# Patient Record
Sex: Male | Born: 1970
Health system: Southern US, Community
[De-identification: ages and names within clinical notes are randomized; demographics above are authoritative.]

## PROBLEM LIST (undated history)

## (undated) DIAGNOSIS — I10 Essential (primary) hypertension: Secondary | ICD-10-CM

## (undated) DIAGNOSIS — J449 Chronic obstructive pulmonary disease, unspecified: Secondary | ICD-10-CM

## (undated) HISTORY — DX: Essential (primary) hypertension: I10

## (undated) HISTORY — DX: Chronic obstructive pulmonary disease, unspecified: J44.9

---

## 2004-09-16 ENCOUNTER — Emergency Department: Payer: Self-pay | Admitting: Emergency Medicine

## 2005-03-04 ENCOUNTER — Emergency Department: Payer: Self-pay | Admitting: Emergency Medicine

## 2006-01-10 ENCOUNTER — Emergency Department: Payer: Self-pay | Admitting: Emergency Medicine

## 2006-01-20 ENCOUNTER — Emergency Department: Payer: Self-pay | Admitting: Emergency Medicine

## 2006-01-21 ENCOUNTER — Emergency Department: Payer: Self-pay | Admitting: Emergency Medicine

## 2006-01-22 ENCOUNTER — Emergency Department: Payer: Self-pay | Admitting: Emergency Medicine

## 2008-08-26 ENCOUNTER — Emergency Department: Payer: Self-pay | Admitting: Emergency Medicine

## 2009-01-08 ENCOUNTER — Emergency Department: Payer: Self-pay | Admitting: Emergency Medicine

## 2010-06-14 ENCOUNTER — Emergency Department: Payer: Self-pay | Admitting: Emergency Medicine

## 2010-06-26 ENCOUNTER — Emergency Department: Payer: Self-pay | Admitting: Emergency Medicine

## 2011-08-20 ENCOUNTER — Emergency Department: Payer: Self-pay | Admitting: Emergency Medicine

## 2011-08-20 LAB — CBC
HCT: 44.1 % (ref 40.0–52.0)
HGB: 14.9 g/dL (ref 13.0–18.0)
RDW: 13.2 % (ref 11.5–14.5)

## 2011-08-20 LAB — COMPREHENSIVE METABOLIC PANEL
Anion Gap: 10 (ref 7–16)
Calcium, Total: 8.4 mg/dL — ABNORMAL LOW (ref 8.5–10.1)
Chloride: 105 mmol/L (ref 98–107)
Co2: 28 mmol/L (ref 21–32)
EGFR (African American): >60
EGFR (Non-African Amer.): >60
Potassium: 3.8 mmol/L (ref 3.5–5.1)
SGOT(AST): 15 U/L (ref 15–37)
SGPT (ALT): 27 U/L

## 2011-08-20 LAB — APTT: Activated PTT: 30.9 secs (ref 23.6–35.9)

## 2011-08-20 LAB — PROTIME-INR: INR: 0.9

## 2011-08-20 LAB — CK TOTAL AND CKMB (NOT AT ARMC)
CK, Total: 130 U/L (ref 35–232)
CK-MB: 1.3 ng/mL (ref 0.5–3.6)

## 2011-08-23 ENCOUNTER — Emergency Department: Payer: Self-pay | Admitting: Emergency Medicine

## 2011-08-23 LAB — COMPREHENSIVE METABOLIC PANEL
Albumin: 3.9 g/dL (ref 3.4–5.0)
Alkaline Phosphatase: 68 U/L (ref 50–136)
BUN: 10 mg/dL (ref 7–18)
Bilirubin,Total: 0.3 mg/dL (ref 0.2–1.0)
Chloride: 102 mmol/L (ref 98–107)
Co2: 30 mmol/L (ref 21–32)
Creatinine: 0.76 mg/dL (ref 0.60–1.30)
EGFR (African American): >60
EGFR (Non-African Amer.): >60
Glucose: 116 mg/dL — ABNORMAL HIGH (ref 65–99)
SGOT(AST): 15 U/L (ref 15–37)
SGPT (ALT): 27 U/L

## 2011-08-23 LAB — PROTIME-INR
INR: 0.9
Prothrombin Time: 13 secs (ref 11.5–14.7)

## 2011-08-23 LAB — URINALYSIS, COMPLETE
Bacteria: NONE SEEN
Bilirubin,UR: NEGATIVE
Glucose,UR: NEGATIVE mg/dL (ref 0–75)
Leukocyte Esterase: NEGATIVE
Nitrite: NEGATIVE
Specific Gravity: 1.013 (ref 1.003–1.030)
Squamous Epithelial: NONE SEEN
WBC UR: NONE SEEN /HPF (ref 0–5)

## 2011-08-23 LAB — CK TOTAL AND CKMB (NOT AT ARMC): CK, Total: 150 U/L (ref 35–232)

## 2011-08-23 LAB — TROPONIN I
Troponin-I: <0.02 ng/mL
Troponin-I: 0.02 ng/mL

## 2012-02-05 ENCOUNTER — Emergency Department: Payer: Self-pay | Admitting: Emergency Medicine

## 2012-02-05 LAB — CBC
HCT: 44.2 % (ref 40.0–52.0)
MCV: 88 fL (ref 80–100)
Platelet: 200 10*3/uL (ref 150–440)
RBC: 5.02 10*6/uL (ref 4.40–5.90)
RDW: 13.1 % (ref 11.5–14.5)
WBC: 8.1 10*3/uL (ref 3.8–10.6)

## 2012-02-05 LAB — URINALYSIS, COMPLETE
Glucose,UR: NEGATIVE mg/dL (ref 0–75)
Leukocyte Esterase: NEGATIVE
Nitrite: NEGATIVE
Protein: NEGATIVE
Specific Gravity: 1.023 (ref 1.003–1.030)
WBC UR: <1 /HPF (ref 0–5)

## 2012-02-05 LAB — BASIC METABOLIC PANEL
Anion Gap: 7 (ref 7–16)
BUN: 13 mg/dL (ref 7–18)
Calcium, Total: 8.6 mg/dL (ref 8.5–10.1)
Chloride: 105 mmol/L (ref 98–107)
Co2: 32 mmol/L (ref 21–32)
EGFR (Non-African Amer.): >60
Glucose: 91 mg/dL (ref 65–99)
Osmolality: 287 (ref 275–301)
Potassium: 4.1 mmol/L (ref 3.5–5.1)

## 2012-02-05 LAB — HEPATIC FUNCTION PANEL A (ARMC)
Albumin: 3.6 g/dL (ref 3.4–5.0)
SGOT(AST): 23 U/L (ref 15–37)
SGPT (ALT): 35 U/L (ref 12–78)

## 2013-02-10 ENCOUNTER — Ambulatory Visit: Payer: Self-pay | Admitting: Internal Medicine

## 2013-03-26 ENCOUNTER — Ambulatory Visit: Payer: Self-pay | Admitting: Internal Medicine

## 2014-01-03 ENCOUNTER — Emergency Department: Payer: Self-pay | Admitting: Emergency Medicine

## 2014-01-03 LAB — CBC
HCT: 43.2 % (ref 40.0–52.0)
HGB: 14.4 g/dL (ref 13.0–18.0)
MCH: 29.3 pg (ref 26.0–34.0)
MCHC: 33.3 g/dL (ref 32.0–36.0)
MCV: 88 fL (ref 80–100)
Platelet: 174 10*3/uL (ref 150–440)
RBC: 4.91 10*6/uL (ref 4.40–5.90)
RDW: 13.6 % (ref 11.5–14.5)
WBC: 11.9 10*3/uL — AB (ref 3.8–10.6)

## 2014-01-04 LAB — BASIC METABOLIC PANEL
ANION GAP: 6 — AB (ref 7–16)
BUN: 9 mg/dL (ref 7–18)
Calcium, Total: 8.5 mg/dL (ref 8.5–10.1)
Chloride: 103 mmol/L (ref 98–107)
Co2: 29 mmol/L (ref 21–32)
Creatinine: 0.89 mg/dL (ref 0.60–1.30)
EGFR (African American): >60
EGFR (Non-African Amer.): >60
GLUCOSE: 88 mg/dL (ref 65–99)
Osmolality: 274 (ref 275–301)
Potassium: 3.9 mmol/L (ref 3.5–5.1)
SODIUM: 138 mmol/L (ref 136–145)

## 2014-01-04 LAB — PRO B NATRIURETIC PEPTIDE: B-TYPE NATIURETIC PEPTID: 333 pg/mL — AB (ref 0–125)

## 2014-01-04 LAB — TROPONIN I: Troponin-I: <0.02 ng/mL
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
## 2016-12-28 ENCOUNTER — Ambulatory Visit: Payer: Self-pay | Admitting: Dietician

## 2017-01-02 ENCOUNTER — Ambulatory Visit: Payer: Self-pay | Admitting: Dietician

## 2017-01-02 ENCOUNTER — Encounter: Payer: Self-pay | Admitting: Dietician

## 2017-07-22 DIAGNOSIS — A084 Viral intestinal infection, unspecified: Secondary | ICD-10-CM | POA: Diagnosis not present

## 2017-08-14 DIAGNOSIS — I10 Essential (primary) hypertension: Secondary | ICD-10-CM | POA: Diagnosis not present

## 2017-08-14 DIAGNOSIS — F411 Generalized anxiety disorder: Secondary | ICD-10-CM | POA: Diagnosis not present

## 2017-08-14 DIAGNOSIS — M722 Plantar fascial fibromatosis: Secondary | ICD-10-CM | POA: Diagnosis not present

## 2017-08-14 DIAGNOSIS — G4733 Obstructive sleep apnea (adult) (pediatric): Secondary | ICD-10-CM | POA: Diagnosis not present

## 2017-12-13 DIAGNOSIS — K5904 Chronic idiopathic constipation: Secondary | ICD-10-CM | POA: Diagnosis not present

## 2017-12-13 DIAGNOSIS — K219 Gastro-esophageal reflux disease without esophagitis: Secondary | ICD-10-CM | POA: Diagnosis not present

## 2017-12-13 DIAGNOSIS — G5793 Unspecified mononeuropathy of bilateral lower limbs: Secondary | ICD-10-CM | POA: Diagnosis not present

## 2017-12-13 DIAGNOSIS — I10 Essential (primary) hypertension: Secondary | ICD-10-CM | POA: Diagnosis not present

## 2018-02-17 DIAGNOSIS — R202 Paresthesia of skin: Secondary | ICD-10-CM | POA: Diagnosis not present

## 2018-02-25 ENCOUNTER — Encounter (INDEPENDENT_AMBULATORY_CARE_PROVIDER_SITE_OTHER): Payer: Self-pay | Admitting: Vascular Surgery

## 2018-02-26 ENCOUNTER — Encounter (INDEPENDENT_AMBULATORY_CARE_PROVIDER_SITE_OTHER): Payer: Self-pay | Admitting: Vascular Surgery

## 2018-02-26 ENCOUNTER — Ambulatory Visit (INDEPENDENT_AMBULATORY_CARE_PROVIDER_SITE_OTHER): Payer: BLUE CROSS/BLUE SHIELD | Admitting: Vascular Surgery

## 2018-02-26 VITALS — BP 155/90 | HR 73 | Resp 14 | Ht 71.0 in | Wt 183.0 lb

## 2018-02-26 DIAGNOSIS — R600 Localized edema: Secondary | ICD-10-CM | POA: Insufficient documentation

## 2018-02-26 DIAGNOSIS — I83813 Varicose veins of bilateral lower extremities with pain: Secondary | ICD-10-CM | POA: Insufficient documentation

## 2018-02-26 NOTE — Progress Notes (Signed)
Subjective:    Patient ID: Gregory Love, male    DOB: 08-13-1970, 47 y.o.   MRN: [id number] Chief Complaint  Patient presents with  . New Patient (Initial Visit)    Varicose Veins and foot swelling   Self-referred new patient for evaluation of bilateral lower extremity painful varicosities and edema.  The patient recently lost over 100 pounds.  The patient endorses a long-standing history of varicosities noted to the bilateral legs.  The patient notes that these varicosities have become progressively more painful over time.  The patient also notes that his varicosities have increased in number in size over the last few years.  The patient also experiences edema to the bilateral legs.  This edema is associated with discomfort as well.  The patient feels that his symptoms have progressed to the point that he is unable to function on a daily basis and they have become more painful. patient feels that his symptoms have not become lifestyle limiting.  At this time patient does not engage in conservative therapy including wearing medical grade 1 compression socks, elevating his legs and remaining active on a daily basis.  The patient denies any recent surgery or trauma to the bilateral legs, DVT history, recurrent cellulitis.  The patient does experience bilateral calf cramping intermittently with activity at times.  The patient denies any rest pain or ulcer formation to the bilateral lower extremity.  Patient denies any fever, nausea vomiting.  Review of Systems  Constitutional: Negative.   HENT: Negative.   Eyes: Negative.   Respiratory: Negative.   Cardiovascular: Positive for leg swelling.       Painful varicose veins  Gastrointestinal: Negative.   Endocrine: Negative.   Genitourinary: Negative.   Musculoskeletal: Negative.   Skin: Negative.   Allergic/Immunologic: Negative.   Neurological: Negative.   Hematological: Negative.   Psychiatric/Behavioral: Negative.       Objective:   Physical Exam  Constitutional: He is oriented to person, place, and time. He appears well-developed and well-nourished. No distress.  HENT:  Head: Normocephalic and atraumatic.  Right Ear: External ear normal.  Left Ear: External ear normal.  Eyes: Pupils are equal, round, and reactive to light. EOM are normal.  Neck: Normal range of motion. Neck supple.  Cardiovascular: Normal rate, regular rhythm, normal heart sounds and intact distal pulses.  Pulses:      Radial pulses are 2+ on the right side, and 2+ on the left side.  Hard to palpate pedal pulses however the bilateral feet are warm  Pulmonary/Chest: Effort normal and breath sounds normal.  Musculoskeletal: Normal range of motion. He exhibits edema (Mild 1+ pitting edema noted bilaterally).  Neurological: He is alert and oriented to person, place, and time.  Skin: He is not diaphoretic.  Scattered greater than 1 cm varicosities noted along the medial aspect of the bilateral legs almost the whole length of each leg.  Scattered less than 1 cm varicosities noted bilaterally.  There is no stasis dermatitis, fibrosis, cellulitis or active ulcerations at this time.  Psychiatric: He has a normal mood and affect. His behavior is normal. Judgment and thought content normal.  Vitals reviewed.  BP (!) 155/90 (BP Location: Right Arm, Patient Position: Sitting)   Pulse 73   Resp 14   Ht 5\' 11"  (1.803 m)   Wt 183 lb (83 kg)   BMI 25.52 kg/m   Past Medical History:  Diagnosis Date  . COPD (chronic obstructive pulmonary disease) (HCC)   . Hypertension  Social History   Socioeconomic History  . Marital status: Married    Spouse name: Not on file  . Number of children: Not on file  . Years of education: Not on file  . Highest education level: Not on file  Occupational History  . Not on file  Social Needs  . Financial resource strain: Not on file  . Food insecurity:    Worry: Not on file    Inability: Not on file  . Transportation  needs:    Medical: Not on file    Non-medical: Not on file  Tobacco Use  . Smoking status: Former Games developer  . Smokeless tobacco: Never Used  . Tobacco comment: Uses dip  Substance and Sexual Activity  . Alcohol use: Not Currently  . Drug use: Never  . Sexual activity: Not on file  Lifestyle  . Physical activity:    Days per week: Not on file    Minutes per session: Not on file  . Stress: Not on file  Relationships  . Social connections:    Talks on phone: Not on file    Gets together: Not on file    Attends religious service: Not on file    Active member of club or organization: Not on file    Attends meetings of clubs or organizations: Not on file    Relationship status: Not on file  . Intimate partner violence:    Fear of current or ex partner: Not on file    Emotionally abused: Not on file    Physically abused: Not on file    Forced sexual activity: Not on file  Other Topics Concern  . Not on file  Social History Narrative  . Not on file   Family History  Problem Relation Age of Onset  . Depression Paternal Uncle    No Known Allergies     Assessment & Plan:  Self-referred new patient for evaluation of bilateral lower extremity painful varicosities and edema.  The patient recently lost over 100 pounds.  The patient endorses a long-standing history of varicosities noted to the bilateral legs.  The patient notes that these varicosities have become progressively more painful over time.  The patient also notes that his varicosities have increased in number in size over the last few years.  The patient also experiences edema to the bilateral legs.  This edema is associated with discomfort as well.  The patient feels that his symptoms have progressed to the point that he is unable to function on a daily basis and they have become more painful. patient feels that his symptoms have not become lifestyle limiting.  At this time patient does not engage in conservative therapy including  wearing medical grade 1 compression socks, elevating his legs and remaining active on a daily basis.  The patient denies any recent surgery or trauma to the bilateral legs, DVT history, recurrent cellulitis.  The patient does experience bilateral calf cramping intermittently with activity at times.  The patient denies any rest pain or ulcer formation to the bilateral lower extremity.  Patient denies any fever, nausea vomiting.  1. Bilateral lower extremity edema - New The patient was encouraged to wear graduated compression stockings (20-30 mmHg) on a daily basis. The patient was instructed to begin wearing the stockings first thing in the morning and removing them in the evening. The patient was instructed specifically not to sleep in the stockings. Prescription given.  In addition, behavioral modification including elevation during the day will be  initiated. Anti-inflammatories for pain. I will bring the patient back in 3 months and have him undergo a bilateral lower extremity venous duplex to rule out any contributing venous versus lymphatic disease The patient will follow up in three months to asses conservative management.  Information on compression stockings was given to the patient. The patient was instructed to call the office in the interim if any worsening edema or ulcerations to the legs, feet or toes occurs. The patient expresses their understanding.  - VAS [place] LOWER EXTREMITY VENOUS REFLUX; Future  2. Varicose veins of bilateral lower extremities with pain - New Patient with multiple risk factors for peripheral artery disease Hard to palpate pedal pulses on exam I will bring the patient his convenience and have him undergo an ABI to assess for any contributing peripheral artery disease I have discussed with the patient at length the risk factors for and pathogenesis of atherosclerotic disease and encouraged a healthy diet, regular exercise regimen and blood pressure / glucose control.    The patient was encouraged to call the office in the interim if he experiences any claudication like symptoms, rest pain or ulcers to his feet / toes.  - VAS [place] ABI WITH/WO TBI; Future  Current Outpatient Medications on File Prior to Visit  Medication Sig Dispense Refill  . ALPRAZolam (XANAX) 1 MG tablet TAKE 1 TABLET BY MOUTH AT BEDTIME AS NEEDED SLEEP  2  . B-D 3CC LUER-LOK SYR 25GX1" 25G X 1" 3 ML MISC See admin instructions.  0  . budesonide-formoterol (SYMBICORT) 160-4.5 MCG/ACT inhaler Inhale into the lungs.    . Cholecalciferol (VITAMIN D-1000 MAX ST) 1000 units tablet Take by mouth.    . Cyanocobalamin (B-12 DOTS) 500 MCG TBDP Take by mouth.    . esomeprazole (NEXIUM) 40 MG capsule Take by mouth.    . gabapentin (NEURONTIN) 300 MG capsule Take by mouth.    . hydrochlorothiazide (HYDRODIURIL) 25 MG tablet Take by mouth.    Marland Kitchen ibuprofen (ADVIL,MOTRIN) 800 MG tablet TAKE 1 TABLET BY MOUTH EVERY 8 HOURS AS NEEDED FOR PAIN    . phentermine 37.5 MG capsule TAKE ONE CAPSULE BY MOUTH EVERY MORNING BEFORE BREAKFAST **DNF 02/21/18**  0  . senna-docusate (SENOKOT-S) 8.6-50 MG tablet Take by mouth.    . traZODone (DESYREL) 50 MG tablet 3 tabs po q hs     No current facility-administered medications on file prior to visit.    There are no Patient Instructions on file for this visit. No follow-ups on file.  KIMBERLY A STEGMAYER, PA-C

## 2018-04-14 DIAGNOSIS — F411 Generalized anxiety disorder: Secondary | ICD-10-CM | POA: Diagnosis not present

## 2018-04-14 DIAGNOSIS — I10 Essential (primary) hypertension: Secondary | ICD-10-CM | POA: Diagnosis not present

## 2018-04-14 DIAGNOSIS — R202 Paresthesia of skin: Secondary | ICD-10-CM | POA: Diagnosis not present

## 2018-04-14 DIAGNOSIS — Z0000 Encounter for general adult medical examination without abnormal findings: Secondary | ICD-10-CM | POA: Diagnosis not present

## 2018-04-14 DIAGNOSIS — K219 Gastro-esophageal reflux disease without esophagitis: Secondary | ICD-10-CM | POA: Diagnosis not present

## 2018-06-10 ENCOUNTER — Encounter (INDEPENDENT_AMBULATORY_CARE_PROVIDER_SITE_OTHER): Payer: BLUE CROSS/BLUE SHIELD

## 2018-06-10 ENCOUNTER — Ambulatory Visit (INDEPENDENT_AMBULATORY_CARE_PROVIDER_SITE_OTHER): Payer: BLUE CROSS/BLUE SHIELD | Admitting: Vascular Surgery

## 2018-06-22 ENCOUNTER — Other Ambulatory Visit: Payer: Self-pay

## 2018-06-22 ENCOUNTER — Encounter: Payer: Self-pay | Admitting: Emergency Medicine

## 2018-06-22 ENCOUNTER — Emergency Department
Admission: EM | Admit: 2018-06-22 | Discharge: 2018-06-22 | Disposition: A | Discharge status: Home / Self Care | Payer: BLUE CROSS/BLUE SHIELD | Attending: Emergency Medicine | Admitting: Emergency Medicine

## 2018-06-22 ENCOUNTER — Emergency Department: Payer: BLUE CROSS/BLUE SHIELD

## 2018-06-22 DIAGNOSIS — Z87891 Personal history of nicotine dependence: Secondary | ICD-10-CM | POA: Insufficient documentation

## 2018-06-22 DIAGNOSIS — J40 Bronchitis, not specified as acute or chronic: Secondary | ICD-10-CM | POA: Diagnosis not present

## 2018-06-22 DIAGNOSIS — Z79899 Other long term (current) drug therapy: Secondary | ICD-10-CM | POA: Diagnosis not present

## 2018-06-22 DIAGNOSIS — I10 Essential (primary) hypertension: Secondary | ICD-10-CM | POA: Insufficient documentation

## 2018-06-22 DIAGNOSIS — R509 Fever, unspecified: Secondary | ICD-10-CM | POA: Diagnosis not present

## 2018-06-22 DIAGNOSIS — J209 Acute bronchitis, unspecified: Secondary | ICD-10-CM | POA: Diagnosis not present

## 2018-06-22 DIAGNOSIS — J449 Chronic obstructive pulmonary disease, unspecified: Secondary | ICD-10-CM | POA: Insufficient documentation

## 2018-06-22 DIAGNOSIS — R05 Cough: Secondary | ICD-10-CM | POA: Diagnosis not present

## 2018-06-22 LAB — INFLUENZA PANEL BY PCR (TYPE A & B)
INFLAPCR: NEGATIVE
Influenza B By PCR: NEGATIVE

## 2018-06-22 MED ORDER — SODIUM CHLORIDE 0.9 % IV BOLUS: 1000.0000 mL | Freq: Once | INTRAVENOUS | Status: DC

## 2018-06-22 MED ORDER — DOXYCYCLINE HYCLATE 50 MG PO CAPS: 100.0000 mg | ORAL_CAPSULE | Freq: Two times a day (BID) | ORAL | 0 refills | Status: AC

## 2018-06-22 MED ORDER — PREDNISONE 10 MG PO TABS: ORAL_TABLET | ORAL | 0 refills | Status: DC

## 2018-06-22 MED ORDER — METHYLPREDNISOLONE SODIUM SUCC 125 MG IJ SOLR
125.0000 mg | Freq: Once | INTRAMUSCULAR | Status: AC
  Administered 2018-06-22: 125 mg via INTRAMUSCULAR
  Filled 2018-06-22: qty 2

## 2018-06-22 NOTE — ED Notes (Signed)
Post solu-medrol shot pt became sweaty and lightheaded; BP 73/46. Laid down in bed and vitals checked again.

## 2018-06-22 NOTE — ED Notes (Signed)
Pt currently sitting up in bed; no longer sweaty; states he feels better. Will notify PA.

## 2018-06-22 NOTE — ED Triage Notes (Signed)
Fever/ chills / body aches , mask applied in triage
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
## 2018-06-22 NOTE — ED Notes (Signed)
See triage note    Presents with a week of body aches   Subjective fever and cough  Afebrile on arrival

## 2018-07-02 DIAGNOSIS — J4520 Mild intermittent asthma, uncomplicated: Secondary | ICD-10-CM | POA: Diagnosis not present

## 2018-07-02 DIAGNOSIS — Z720 Tobacco use: Secondary | ICD-10-CM | POA: Diagnosis not present

## 2018-07-02 DIAGNOSIS — F5104 Psychophysiologic insomnia: Secondary | ICD-10-CM | POA: Diagnosis not present

## 2018-07-02 DIAGNOSIS — F411 Generalized anxiety disorder: Secondary | ICD-10-CM | POA: Diagnosis not present

## 2018-07-09 ENCOUNTER — Ambulatory Visit (INDEPENDENT_AMBULATORY_CARE_PROVIDER_SITE_OTHER): Payer: BLUE CROSS/BLUE SHIELD

## 2018-07-09 ENCOUNTER — Encounter (INDEPENDENT_AMBULATORY_CARE_PROVIDER_SITE_OTHER): Payer: Self-pay | Admitting: Nurse Practitioner

## 2018-07-09 ENCOUNTER — Ambulatory Visit (INDEPENDENT_AMBULATORY_CARE_PROVIDER_SITE_OTHER): Payer: BLUE CROSS/BLUE SHIELD | Admitting: Vascular Surgery

## 2018-07-09 VITALS — BP 123/74 | HR 55 | Resp 16 | Ht 71.0 in | Wt 176.6 lb

## 2018-07-09 DIAGNOSIS — I890 Lymphedema, not elsewhere classified: Secondary | ICD-10-CM | POA: Diagnosis not present

## 2018-07-09 DIAGNOSIS — I83813 Varicose veins of bilateral lower extremities with pain: Secondary | ICD-10-CM

## 2018-07-09 DIAGNOSIS — I872 Venous insufficiency (chronic) (peripheral): Secondary | ICD-10-CM | POA: Diagnosis not present

## 2018-07-14 ENCOUNTER — Encounter (INDEPENDENT_AMBULATORY_CARE_PROVIDER_SITE_OTHER): Payer: Self-pay | Admitting: Vascular Surgery

## 2018-07-14 DIAGNOSIS — I872 Venous insufficiency (chronic) (peripheral): Secondary | ICD-10-CM | POA: Insufficient documentation

## 2018-07-14 DIAGNOSIS — I890 Lymphedema, not elsewhere classified: Secondary | ICD-10-CM | POA: Insufficient documentation

## 2018-07-14 NOTE — Progress Notes (Signed)
Subjective:    Patient ID: Gregory Love, male    DOB: 07-28-1970, 48 y.o.   MRN: [id number] Chief Complaint  Patient presents with  . Follow-up   Patient presents for a 57-month follow-up.  The patient was initially seen for painful large varicosities located to the bilateral legs.  The patient was also experiencing lower extremity edema also associated with discomfort.  Over the last 3 months, the patient has been engaging in conservative therapy on a daily basis.  The patient has been wearing medical grade 1 compression socks, elevating his legs heart level or higher and remaining active with minimal improvement to the pain located along his large varicosities.  The patient also experiences a fair amount of edema to the bilateral legs even though he has been engaging in conservative therapy.  The patient feels that his symptoms have progressed to the point that he is unable to function on a daily basis and they have become lifestyle limiting.  The patient underwent a bilateral ABI which was notable for: Right: 1.24, triphasic tibials.  No evidence of significant right lower extremity arterial disease. Left: 1.14, triphasic tibials.  No evidence of left lower extremity arterial disease. The patient also underwent a bilateral lower extremity venous reflux study which was notable for: Right: Reflux noted in the common femoral vein, great saphenous vein at the proximal thigh no SVT or DVT. Left: Reflux noted in the common femoral vein.  No SVT or DVT. Patient denies any ulcer formation to the bilateral lower extremity.  Patient denies any fever, nausea vomiting.  Review of Systems  Constitutional: Negative.   HENT: Negative.   Eyes: Negative.   Respiratory: Negative.   Cardiovascular: Positive for leg swelling.       Painful varicosities located to the bilateral legs Painful edema to the bilateral legs  Gastrointestinal: Negative.   Endocrine: Negative.   Genitourinary: Negative.     Musculoskeletal: Negative.   Skin: Negative.   Allergic/Immunologic: Negative.   Neurological: Negative.   Hematological: Negative.   Psychiatric/Behavioral: Negative.       Objective:   Physical Exam Vitals signs reviewed.  Constitutional:      Appearance: Normal appearance.  HENT:     Head: Normocephalic and atraumatic.     Right Ear: External ear normal.     Left Ear: External ear normal.     Nose: Nose normal.     Mouth/Throat:     Mouth: Mucous membranes are moist.     Pharynx: Oropharynx is clear.  Eyes:     Extraocular Movements: Extraocular movements intact.     Conjunctiva/sclera: Conjunctivae normal.     Pupils: Pupils are equal, round, and reactive to light.  Neck:     Musculoskeletal: Normal range of motion.  Cardiovascular:     Rate and Rhythm: Normal rate and regular rhythm.  Pulmonary:     Effort: Pulmonary effort is normal.     Breath sounds: Normal breath sounds.  Musculoskeletal: Normal range of motion.        General: Swelling (Moderate 1+ pitting edema noted bilaterally) present.  Skin:    Comments: Right lower extremity: Extensive greater than 1 cm varicosities.  Moderate stasis dermatitis. Right lower extremity: Scattered greater than 1 cm varicosities.  Moderate stasis dermatitis.  Neurological:     General: No focal deficit present.     Mental Status: He is alert and oriented to person, place, and time.  Psychiatric:        Mood  and Affect: Mood normal.        Behavior: Behavior normal.        Thought Content: Thought content normal.        Judgment: Judgment normal.    BP 123/74 (BP Location: Left Arm, Patient Position: Sitting, Cuff Size: Normal)   Pulse (!) 55   Resp 16   Ht 5\' 11"  (1.803 m)   Wt 176 lb 9.6 oz (80.1 kg)   BMI 24.63 kg/m   Past Medical History:  Diagnosis Date  . COPD (chronic obstructive pulmonary disease) (HCC)   . Hypertension    Social History   Socioeconomic History  . Marital status: Married    Spouse  name: Not on file  . Number of children: Not on file  . Years of education: Not on file  . Highest education level: Not on file  Occupational History  . Not on file  Social Needs  . Financial resource strain: Not on file  . Food insecurity:    Worry: Not on file    Inability: Not on file  . Transportation needs:    Medical: Not on file    Non-medical: Not on file  Tobacco Use  . Smoking status: Former Games developermoker  . Smokeless tobacco: Never Used  . Tobacco comment: Uses dip  Substance and Sexual Activity  . Alcohol use: Not Currently  . Drug use: Never  . Sexual activity: Not on file  Lifestyle  . Physical activity:    Days per week: Not on file    Minutes per session: Not on file  . Stress: Not on file  Relationships  . Social connections:    Talks on phone: Not on file    Gets together: Not on file    Attends religious service: Not on file    Active member of club or organization: Not on file    Attends meetings of clubs or organizations: Not on file    Relationship status: Not on file  . Intimate partner violence:    Fear of current or ex partner: Not on file    Emotionally abused: Not on file    Physically abused: Not on file    Forced sexual activity: Not on file  Other Topics Concern  . Not on file  Social History Narrative  . Not on file   No past surgical history on file.  Family History  Problem Relation Age of Onset  . Depression Paternal Uncle    No Known Allergies     Assessment & Plan:  Patient presents for a 9274-month follow-up.  The patient was initially seen for painful large varicosities located to the bilateral legs.  The patient was also experiencing lower extremity edema also associated with discomfort.  Over the last 3 months, the patient has been engaging in conservative therapy on a daily basis.  The patient has been wearing medical grade 1 compression socks, elevating his legs heart level or higher and remaining active with minimal improvement  to the pain located along his large varicosities.  The patient also experiences a fair amount of edema to the bilateral legs even though he has been engaging in conservative therapy.  The patient feels that his symptoms have progressed to the point that he is unable to function on a daily basis and they have become lifestyle limiting.  The patient underwent a bilateral ABI which was notable for: Right: 1.24, triphasic tibials.  No evidence of significant right lower extremity arterial disease. Left: 1.14,  triphasic tibials.  No evidence of left lower extremity arterial disease. The patient also underwent a bilateral lower extremity venous reflux study which was notable for: Right: Reflux noted in the common femoral vein, great saphenous vein at the proximal thigh no SVT or DVT. Left: Reflux noted in the common femoral vein.  No SVT or DVT. Patient denies any ulcer formation to the bilateral lower extremity.  Patient denies any fever, nausea vomiting.  1. Lymphedema - New Despite conservative treatments including exercise, elevation and class I compression stockings the patient still presents with stage II lymphedema The patient would greatly benefit from the added therapy of a lymphedema pump The patient is to continue engaging in conservative therapy while apply to his insurance for lymphedema pump The patient was instructed to call the office in the interim if any worsening edema or ulcerations to the legs, feet or toes occurs. The patient expresses their understanding  2. Chronic venous insufficiency - New Due to the location of chronic venous insufficiency noted to the bilateral common femoral veins he is not a candidate for endovenous laser ablation to that area.  He does have reflux noted to the right great saphenous vein at the proximal thigh however this area is small and I do not feel endovenous laser ablation to such a small area would provide much improvement.  3. Varicose veins of  bilateral lower extremities with pain - Stable The patient would greatly benefit from foam sclerotherapy into the greater than 1 cm large varicosities noted to the bilateral lower extremity The patient has engaged in conservative therapy over 3 months including wearing medical grade 1 compression socks, elevating his legs and remaining active with no improvement in his discomfort The patient notes that his symptoms have progressed to the point that he is unable to function on a daily basis and they have become lifestyle limiting These large painful varicosities will improve the patient's discomfort and ability to function on a daily basis I will apply to his insurance for foam sclerotherapy to his bilateral lower extremity. In the meantime, the patient is to continue to engage in conservative therapy  Current Outpatient Medications on File Prior to Visit  Medication Sig Dispense Refill  . albuterol (PROVENTIL HFA;VENTOLIN HFA) 108 (90 Base) MCG/ACT inhaler Inhale into the lungs.    . ALPRAZolam (XANAX) 1 MG tablet TAKE 1 TABLET BY MOUTH AT BEDTIME AS NEEDED SLEEP  2  . B-D 3CC LUER-LOK SYR 25GX1" 25G X 1" 3 ML MISC See admin instructions.  0  . budesonide-formoterol (SYMBICORT) 160-4.5 MCG/ACT inhaler Inhale into the lungs.    . Cyanocobalamin (B-12 DOTS) 500 MCG TBDP Take by mouth.    Marland Kitchen. ibuprofen (ADVIL,MOTRIN) 800 MG tablet TAKE 1 TABLET BY MOUTH EVERY 8 HOURS AS NEEDED FOR PAIN    . phentermine 37.5 MG capsule TAKE ONE CAPSULE BY MOUTH EVERY MORNING BEFORE BREAKFAST **DNF 02/21/18**  0  . senna-docusate (SENOKOT-S) 8.6-50 MG tablet Take by mouth.    . traZODone (DESYREL) 50 MG tablet 3 tabs po q hs    . gabapentin (NEURONTIN) 300 MG capsule Take by mouth.     No current facility-administered medications on file prior to visit.     There are no Patient Instructions on file for this visit. No follow-ups on file.   Geana Walts A Veronique Warga, PA-C

## 2018-08-07 ENCOUNTER — Encounter (INDEPENDENT_AMBULATORY_CARE_PROVIDER_SITE_OTHER): Payer: Self-pay | Admitting: Vascular Surgery

## 2018-08-07 ENCOUNTER — Ambulatory Visit (INDEPENDENT_AMBULATORY_CARE_PROVIDER_SITE_OTHER): Payer: BLUE CROSS/BLUE SHIELD | Admitting: Vascular Surgery

## 2018-08-07 VITALS — BP 121/72 | HR 67 | Resp 14 | Ht 71.0 in | Wt 175.2 lb

## 2018-08-07 DIAGNOSIS — I83813 Varicose veins of bilateral lower extremities with pain: Secondary | ICD-10-CM | POA: Diagnosis not present

## 2018-08-07 NOTE — Progress Notes (Signed)
Gregory Love is a 48 y.o.male who presents with painful varicose veins of the right leg  Past Medical History:  Diagnosis Date  . COPD (chronic obstructive pulmonary disease) (HCC)   . Hypertension     History reviewed. No pertinent surgical history.  Current Outpatient Medications  Medication Sig Dispense Refill  . albuterol (PROVENTIL HFA;VENTOLIN HFA) 108 (90 Base) MCG/ACT inhaler Inhale into the lungs.    . ALPRAZolam (XANAX) 1 MG tablet TAKE 1 TABLET BY MOUTH AT BEDTIME AS NEEDED SLEEP  2  . B-D 3CC LUER-LOK SYR 25GX1" 25G X 1" 3 ML MISC See admin instructions.  0  . budesonide-formoterol (SYMBICORT) 160-4.5 MCG/ACT inhaler Inhale into the lungs.    . Cyanocobalamin (B-12 DOTS) 500 MCG TBDP Take by mouth.    . gabapentin (NEURONTIN) 300 MG capsule Take by mouth.    Marland Kitchen ibuprofen (ADVIL,MOTRIN) 800 MG tablet TAKE 1 TABLET BY MOUTH EVERY 8 HOURS AS NEEDED FOR PAIN    . phentermine 37.5 MG capsule TAKE ONE CAPSULE BY MOUTH EVERY MORNING BEFORE BREAKFAST **DNF 02/21/18**  0  . senna-docusate (SENOKOT-S) 8.6-50 MG tablet Take by mouth.    . traZODone (DESYREL) 50 MG tablet 3 tabs po q hs     No current facility-administered medications for this visit.     No Known Allergies  Indication: Patient presents with symptomatic varicose veins of the right lower extremity.  Procedure: Foam sclerotherapy was performed on the right lower extremity. Using ultrasound guidance, 5 mL of foam Sotradecol was used to inject the varicosities of the right lower extremity. Compression wraps were placed. The patient tolerated the procedure well.

## 2018-08-18 ENCOUNTER — Ambulatory Visit (INDEPENDENT_AMBULATORY_CARE_PROVIDER_SITE_OTHER): Payer: BLUE CROSS/BLUE SHIELD | Admitting: Vascular Surgery

## 2018-08-27 ENCOUNTER — Telehealth (INDEPENDENT_AMBULATORY_CARE_PROVIDER_SITE_OTHER): Payer: Self-pay | Admitting: Vascular Surgery

## 2018-08-27 NOTE — Telephone Encounter (Signed)
Patient would need an appt to discuss his questions about his procedure.

## 2018-09-01 ENCOUNTER — Encounter (INDEPENDENT_AMBULATORY_CARE_PROVIDER_SITE_OTHER): Payer: Self-pay | Admitting: Vascular Surgery

## 2018-11-05 DIAGNOSIS — Z720 Tobacco use: Secondary | ICD-10-CM | POA: Diagnosis not present

## 2018-11-05 DIAGNOSIS — M25532 Pain in left wrist: Secondary | ICD-10-CM | POA: Diagnosis not present

## 2018-11-05 DIAGNOSIS — I10 Essential (primary) hypertension: Secondary | ICD-10-CM | POA: Diagnosis not present

## 2018-11-05 DIAGNOSIS — F411 Generalized anxiety disorder: Secondary | ICD-10-CM | POA: Diagnosis not present

## 2018-11-05 DIAGNOSIS — F5104 Psychophysiologic insomnia: Secondary | ICD-10-CM | POA: Diagnosis not present

## 2018-11-05 DIAGNOSIS — M25531 Pain in right wrist: Secondary | ICD-10-CM | POA: Diagnosis not present

## 2019-03-09 DIAGNOSIS — F411 Generalized anxiety disorder: Secondary | ICD-10-CM | POA: Diagnosis not present

## 2019-03-09 DIAGNOSIS — Z720 Tobacco use: Secondary | ICD-10-CM | POA: Diagnosis not present

## 2019-03-09 DIAGNOSIS — G4733 Obstructive sleep apnea (adult) (pediatric): Secondary | ICD-10-CM | POA: Diagnosis not present

## 2019-03-09 DIAGNOSIS — M159 Polyosteoarthritis, unspecified: Secondary | ICD-10-CM | POA: Diagnosis not present

## 2019-03-09 DIAGNOSIS — I10 Essential (primary) hypertension: Secondary | ICD-10-CM | POA: Diagnosis not present

## 2019-03-09 DIAGNOSIS — R202 Paresthesia of skin: Secondary | ICD-10-CM | POA: Diagnosis not present

## 2019-04-30 DIAGNOSIS — R200 Anesthesia of skin: Secondary | ICD-10-CM | POA: Diagnosis not present

## 2019-04-30 DIAGNOSIS — E519 Thiamine deficiency, unspecified: Secondary | ICD-10-CM | POA: Diagnosis not present

## 2019-04-30 DIAGNOSIS — E538 Deficiency of other specified B group vitamins: Secondary | ICD-10-CM | POA: Diagnosis not present

## 2019-04-30 DIAGNOSIS — E531 Pyridoxine deficiency: Secondary | ICD-10-CM | POA: Diagnosis not present
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
## 2021-09-23 ENCOUNTER — Emergency Department: Payer: BC Managed Care – PPO

## 2021-09-23 ENCOUNTER — Other Ambulatory Visit: Payer: Self-pay

## 2021-09-23 ENCOUNTER — Emergency Department
Admission: EM | Admit: 2021-09-23 | Discharge: 2021-09-23 | Disposition: A | Discharge status: Home / Self Care | Payer: BC Managed Care – PPO | Attending: Emergency Medicine | Admitting: Emergency Medicine

## 2021-09-23 DIAGNOSIS — I10 Essential (primary) hypertension: Secondary | ICD-10-CM | POA: Diagnosis not present

## 2021-09-23 DIAGNOSIS — R109 Unspecified abdominal pain: Secondary | ICD-10-CM | POA: Diagnosis not present

## 2021-09-23 DIAGNOSIS — J449 Chronic obstructive pulmonary disease, unspecified: Secondary | ICD-10-CM | POA: Insufficient documentation

## 2021-09-23 DIAGNOSIS — M545 Low back pain, unspecified: Secondary | ICD-10-CM | POA: Insufficient documentation

## 2021-09-23 DIAGNOSIS — Z7951 Long term (current) use of inhaled steroids: Secondary | ICD-10-CM | POA: Diagnosis not present

## 2021-09-23 LAB — BASIC METABOLIC PANEL
Anion gap: 9 (ref 5–15)
BUN: 13 mg/dL (ref 6–20)
CO2: 28 mmol/L (ref 22–32)
Calcium: 9.2 mg/dL (ref 8.9–10.3)
Chloride: 100 mmol/L (ref 98–111)
Creatinine, Ser: 0.83 mg/dL (ref 0.61–1.24)
GFR, Estimated: >60 mL/min (ref >60)
Glucose, Bld: 98 mg/dL (ref 70–99)
Potassium: 3.7 mmol/L (ref 3.5–5.1)
Sodium: 137 mmol/L (ref 135–145)

## 2021-09-23 LAB — URINALYSIS, ROUTINE W REFLEX MICROSCOPIC
Bilirubin Urine: NEGATIVE
Glucose, UA: NEGATIVE mg/dL
Hgb urine dipstick: NEGATIVE
Ketones, ur: NEGATIVE mg/dL
Leukocytes,Ua: NEGATIVE
Nitrite: NEGATIVE
Protein, ur: NEGATIVE mg/dL
Specific Gravity, Urine: 1.02 (ref 1.005–1.030)
pH: 5 (ref 5.0–8.0)

## 2021-09-23 LAB — CBC
HCT: 44.2 % (ref 39.0–52.0)
Hemoglobin: 14.4 g/dL (ref 13.0–17.0)
MCH: 29.8 pg (ref 26.0–34.0)
MCHC: 32.6 g/dL (ref 30.0–36.0)
MCV: 91.3 fL (ref 80.0–100.0)
Platelets: 212 10*3/uL (ref 150–400)
RBC: 4.84 MIL/uL (ref 4.22–5.81)
RDW: 12.6 % (ref 11.5–15.5)
WBC: 10.4 10*3/uL (ref 4.0–10.5)
nRBC: 0 % (ref 0.0–0.2)

## 2021-09-23 MED ORDER — HYDROMORPHONE HCL 1 MG/ML IJ SOLN
1.0000 mg | Freq: Once | INTRAMUSCULAR | Status: AC
  Administered 2021-09-23: 1 mg via INTRAVENOUS
  Filled 2021-09-23: qty 1

## 2021-09-23 MED ORDER — OXYCODONE-ACETAMINOPHEN 5-325 MG PO TABS: 2.0000 | ORAL_TABLET | Freq: Four times a day (QID) | ORAL | 0 refills | Status: AC | PRN

## 2021-09-23 MED ORDER — ONDANSETRON 4 MG PO TBDP: 4.0000 mg | ORAL_TABLET | Freq: Four times a day (QID) | ORAL | 0 refills | Status: AC | PRN

## 2021-09-23 MED ORDER — ONDANSETRON HCL 4 MG/2ML IJ SOLN
4.0000 mg | Freq: Once | INTRAMUSCULAR | Status: AC
  Administered 2021-09-23: 4 mg via INTRAVENOUS
  Filled 2021-09-23: qty 2

## 2021-09-23 MED ORDER — KETOROLAC TROMETHAMINE 30 MG/ML IJ SOLN
30.0000 mg | Freq: Once | INTRAMUSCULAR | Status: AC
  Administered 2021-09-23: 30 mg via INTRAVENOUS
  Filled 2021-09-23: qty 1

## 2021-09-23 MED ORDER — LIDOCAINE 5 % EX PTCH: 1.0000 | MEDICATED_PATCH | Freq: Two times a day (BID) | CUTANEOUS | 0 refills | Status: AC

## 2021-09-23 MED ORDER — IBUPROFEN 800 MG PO TABS: 800.0000 mg | ORAL_TABLET | Freq: Three times a day (TID) | ORAL | 0 refills | Status: AC | PRN

## 2021-09-23 NOTE — ED Provider Notes (Signed)
? ?[facility] ?Provider Note ? ? ? Event Date/Time  ? First MD Initiated Contact with Patient 09/23/21 0103   ?  (approximate) ? ? ?History  ? ?Flank Pain ? ? ?HPI ? ?Gregory Love is a 51 y.o. male with history of hypertension, COPD who presents to the emergency department for left lower back pain that is sharp and severe in nature.  States pain has been ongoing for the past week but worsened today.  Denies any known injury.  Worse with palpation and movement.  He states he is concerned he has a kidney stone but has never had 1 before.  No dysuria, hematuria.  No numbness, tingling or weakness.  No bowel or bladder incontinence.  No urinary retention.  No fever.  No history of back surgeries, epidural injections.  No history of HIV, diabetes, cancer, IV drug abuse. ? ? ?History provided by patient and wife. ? ? ? ?Past Medical History:  ?Diagnosis Date  ? COPD (chronic obstructive pulmonary disease) (HCC)   ? Hypertension   ? ? ?No past surgical history on file. ? ?MEDICATIONS:  ?Prior to Admission medications   ?Medication Sig Start Date End Date Taking? Authorizing Provider  ?albuterol (PROVENTIL HFA;VENTOLIN HFA) 108 (90 Base) MCG/ACT inhaler Inhale into the lungs. 07/02/18 07/02/19  [provider]  ?ALPRAZolam Prudy Feeler(XANAX) 1 MG tablet TAKE 1 TABLET BY MOUTH AT BEDTIME AS NEEDED SLEEP 02/17/18   [provider]  ?B-D 3CC LUER-LOK SYR 25GX1" 25G X 1" 3 ML MISC See admin instructions. 01/07/18   [provider]  ?budesonide-formoterol (SYMBICORT) 160-4.5 MCG/ACT inhaler Inhale into the lungs. 08/14/17 08/14/18  [provider]  ?Cyanocobalamin (B-12 DOTS) 500 MCG TBDP Take by mouth.    [provider]  ?gabapentin (NEURONTIN) 300 MG capsule Take by mouth. 01/06/18 01/06/19  [provider]  ?ibuprofen (ADVIL,MOTRIN) 800 MG tablet TAKE 1 TABLET BY MOUTH EVERY 8 HOURS AS NEEDED FOR PAIN 11/12/17   [provider]  ?phentermine 37.5 MG capsule  TAKE ONE CAPSULE BY MOUTH EVERY MORNING BEFORE BREAKFAST **DNF 02/21/18** 02/21/18   [provider]  ?traZODone (DESYREL) 50 MG tablet 3 tabs po q hs 08/14/17   [provider]  ? ? ?Physical Exam  ? ?Triage Vital Signs: ?ED Triage Vitals  ?Enc Vitals Group  ?   BP 09/23/21 0013 (!) 164/94  ?   Pulse Rate 09/23/21 0013 77  ?   Resp 09/23/21 0013 20  ?   Temp 09/23/21 0013 98.6 ?F (37 ?C)  ?   Temp Source 09/23/21 0013 Oral  ?   SpO2 09/23/21 0013 96 %  ?   Weight 09/23/21 0013 238 lb (108 kg)  ?   Height 09/23/21 0013 5\' 11"  (1.803 m)  ?   Head Circumference --   ?   Peak Flow --   ?   Pain Score 09/23/21 0013 10  ?   Pain Loc --   ?   Pain Edu? --   ?   Excl. in GC? --   ? ? ?Most recent vital signs: ?Vitals:  ? 09/23/21 0130 09/23/21 0344  ?BP: 136/84 (!) 125/54  ?Pulse: 68 75  ?Resp:  18  ?Temp:    ?SpO2: 100% 99%  ? ? ?CONSTITUTIONAL: Alert and oriented and responds appropriately to questions.  Appears uncomfortable, afebrile ?HEAD: Normocephalic, atraumatic ?EYES: Conjunctivae clear, pupils appear equal, sclera nonicteric ?ENT: normal nose; moist mucous membranes ?NECK: Supple, normal ROM ?CARD: RRR;  S1 and S2 appreciated; no murmurs, no clicks, no rubs, no gallops ?RESP: Normal chest excursion without splinting or tachypnea; breath sounds clear and equal bilaterally; no wheezes, no rhonchi, no rales, no hypoxia or respiratory distress, speaking full sentences ?ABD/GI: Normal bowel sounds; non-distended; soft, non-tender, no rebound, no guarding, no peritoneal signs ?BACK: The back appears normal, tender to palpation over the left lumbar paraspinal muscles without redness, warmth, soft tissue swelling or ecchymosis.  No rash or other lesions.  No midline spinal tenderness or step-off or deformity. ?EXT: Normal ROM in all joints; no deformity noted, no edema; no cyanosis ?SKIN: Normal color for age and race; warm; no rash on exposed skin ?NEURO: Moves all extremities equally, normal speech,  normal sensation diffusely, no saddle anesthesia, no hyperreflexia, ambulates with normal gait ?PSYCH: The patient's mood and manner are appropriate. ? ? ?ED Results / Procedures / Treatments  ? ?LABS: ?(all labs ordered are listed, but only abnormal results are displayed) ?Labs Reviewed  ?URINALYSIS, ROUTINE W REFLEX MICROSCOPIC - Abnormal; Notable for the following components:  ?    Result Value  ? Color, Urine YELLOW (*)   ? APPearance CLEAR (*)   ? All other components within normal limits  ?BASIC METABOLIC PANEL  ?CBC  ? ? ? ?EKG: ? ? ?RADIOLOGY: ?My personal review and interpretation of imaging: CT scan shows no kidney stone, hydronephrosis, pyelonephritis.  No bony abnormality of the spine. ? ?I have personally reviewed all radiology reports.   ?CT Renal Stone Study ? ?Result Date: 09/23/2021 ?CLINICAL DATA:  Left flank pain.  Kidney stones suspected. EXAM: CT ABDOMEN AND PELVIS WITHOUT CONTRAST TECHNIQUE: Multidetector CT imaging of the abdomen and pelvis was performed following the standard protocol without IV contrast. RADIATION DOSE REDUCTION: This exam was performed according to the departmental dose-optimization program which includes automated exposure control, adjustment of the mA and/or kV according to patient size and/or use of iterative reconstruction technique. COMPARISON:  02/05/2012 FINDINGS: Lower chest: Lung bases are clear. Hepatobiliary: No focal liver abnormality is seen. No gallstones, gallbladder wall thickening, or biliary dilatation. Pancreas: Unremarkable. No pancreatic ductal dilatation or surrounding inflammatory changes. Spleen: Normal in size without focal abnormality. Adrenals/Urinary Tract: Adrenal glands are unremarkable. Kidneys are normal, without renal calculi, focal lesion, or hydronephrosis. Bladder is unremarkable. Stomach/Bowel: Stomach is within normal limits. Appendix appears normal. No evidence of bowel wall thickening, distention, or inflammatory changes.  Vascular/Lymphatic: Aortic atherosclerosis. No enlarged abdominal or pelvic lymph nodes. Reproductive: Prostate is unremarkable. Other: No free air or free fluid in the abdomen. Moderate periumbilical hernia containing fat. Musculoskeletal: No acute or significant osseous findings. IMPRESSION: 1. No renal or ureteral stone or obstruction. 2. Aortic atherosclerosis. 3. Periumbilical hernia containing fat. Electronically Signed   By: Burman Nieves M.D.   On: 09/23/2021 02:49   ? ? ?PROCEDURES: ? ?Critical Care performed: No ? ? ?CRITICAL CARE ?Performed by: Baxter Hire Chadd Tollison ? ? ?Total critical care time: 0 minutes ? ?Critical care time was exclusive of separately billable procedures and treating other patients. ? ?Critical care was necessary to treat or prevent imminent or life-threatening deterioration. ? ?Critical care was time spent personally by me on the following activities: development of treatment plan with patient and/or surrogate as well as nursing, discussions with consultants, evaluation of patient's response to treatment, examination of patient, obtaining history from patient or surrogate, ordering and performing treatments and interventions, ordering and review of laboratory studies, ordering and review of radiographic studies, pulse oximetry and re-evaluation of  patient's condition. ? ? ?Procedures ? ? ? ?IMPRESSION / MDM / ASSESSMENT AND PLAN / ED COURSE  ?I reviewed the triage vital signs and the nursing notes. ? ? ? ?Patient here with increasing left lower back pain. ? ? ? ?DIFFERENTIAL DIAGNOSIS (includes but not limited to):   Musculoskeletal back pain, spasm, strain, less likely fracture, cauda equina, epidural abscess or hematoma, discitis or osteomyelitis, transverse myelitis.  Differential also includes kidney stone but less likely UTI, pyelonephritis. ? ? ?PLAN: We will obtain CBC, BMP, urinalysis, CT renal scan.  Will give Toradol, Dilaudid, Zofran for symptomatic relief. ? ? ?MEDICATIONS  GIVEN IN ED: ?Medications  ?HYDROmorphone (DILAUDID) injection 1 mg (1 mg Intravenous Given 09/23/21 0218)  ?ondansetron [facility]) injection 4 mg (4 mg Intravenous Given 09/23/21 0216)  ?ketorolac (TORADOL) 30 MG/ML injection 30 m

## 2021-09-23 NOTE — ED Triage Notes (Signed)
Pt states left sided flank and back pain for two days. Pt denies vomiting, nausea, hematuria or fever.  ?

## 2021-09-23 NOTE — Discharge Instructions (Addendum)
You are being provided a prescription for opiates (also known as narcotics) for pain control.  Opiates can be addictive and should only be used when absolutely necessary for pain control when other alternatives do not work.  We recommend you only use them for the recommended amount of time and only as prescribed.  Please do not take with other sedative medications or alcohol.  Please do not drive, operate machinery, make important decisions while taking opiates.  Please note that these medications can be addictive and have high abuse potential.  Patients can become addicted to narcotics after only taking them for a few days.  Please keep these medications locked away from children, teenagers or any family members with history of substance abuse.  Narcotic pain medicine may also make you constipated.  You may use over-the-counter medications such as MiraLAX, Colace to prevent constipation.  If you become constipated, you may use over-the-counter enemas as needed.  Itching and nausea are also common side effects of narcotic pain medication.  If you develop uncontrolled vomiting or a rash, please stop these medications and seek medical care. ? ? ?Your labs, CT scan, urine showed no acute abnormality.  I suspect that your back pain is musculoskeletal in nature.  You may alternate heat and ice to this area.  Please no strenuous activities for the next week or until symptoms have significantly improved.  If you develop worsening pain is uncontrolled with medications at home, numbness or weakness, difficulty holding your bowel or bladder, difficulty emptying your bladder, fever, please return to the emergency department. ? ?

## 2022-09-05 IMAGING — CT CT RENAL STONE PROTOCOL
2 of 4 series · 16 of 46 positions shown, 18 images · non-contrast
Comparison: 02/05/2012

CLINICAL DATA: Left flank pain.  Kidney stones suspected.



[Series 2: stone full standard · axial · 0.98mm/px · z∈[-874,-404]mm · 13 of 104 slices shown, 15 images]
[im 5/104  soft-tissue]
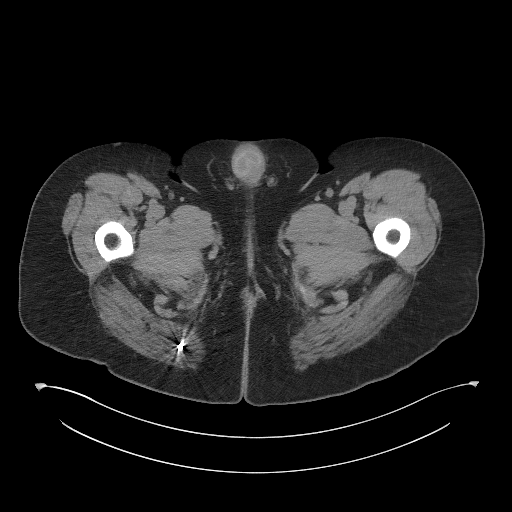
[im 5/104  bone]
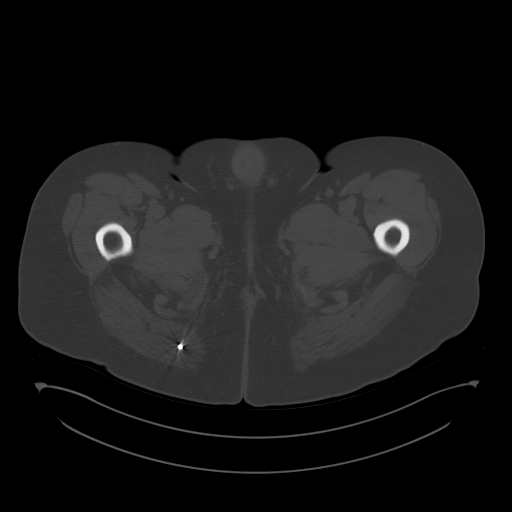
[im 14/104  soft-tissue]
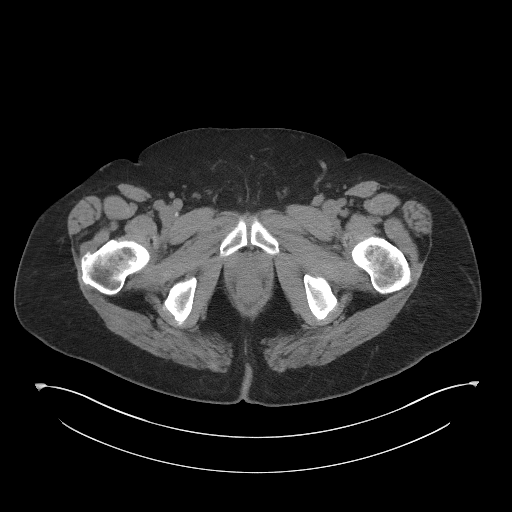
[im 23/104  soft-tissue]
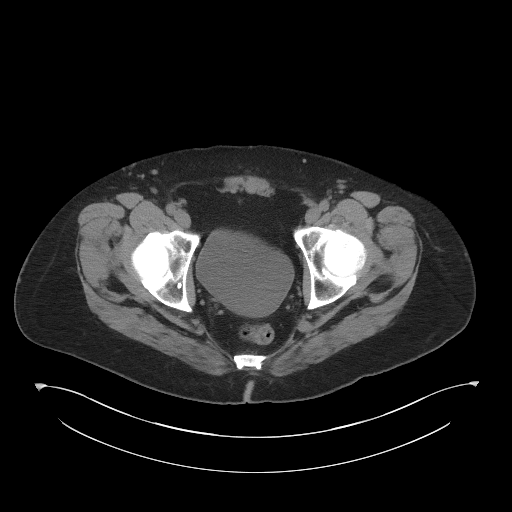
[im 27/104  soft-tissue]
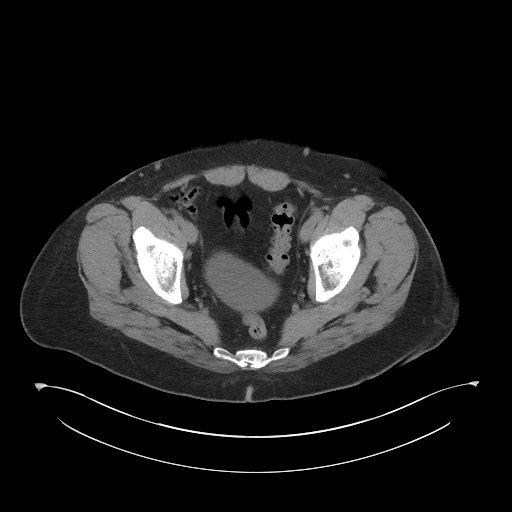
[im 36/104  soft-tissue]
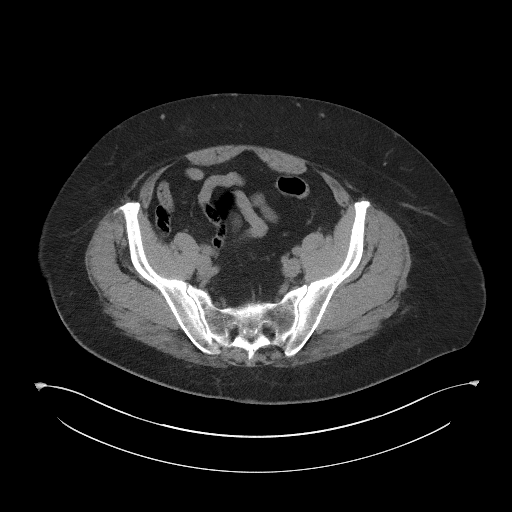
[im 45/104  soft-tissue]
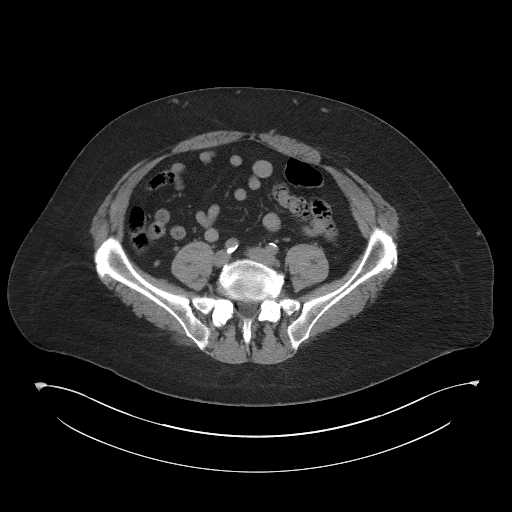
[im 54/104  soft-tissue]
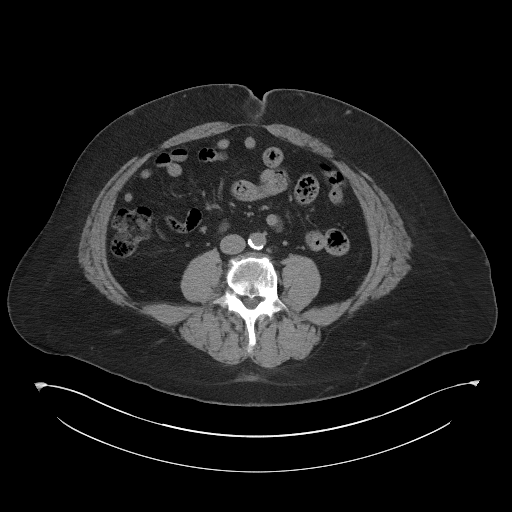
[im 59/104  soft-tissue]
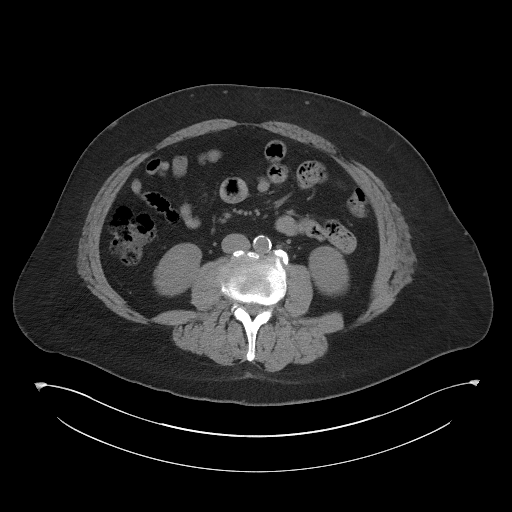
[im 68/104  soft-tissue]
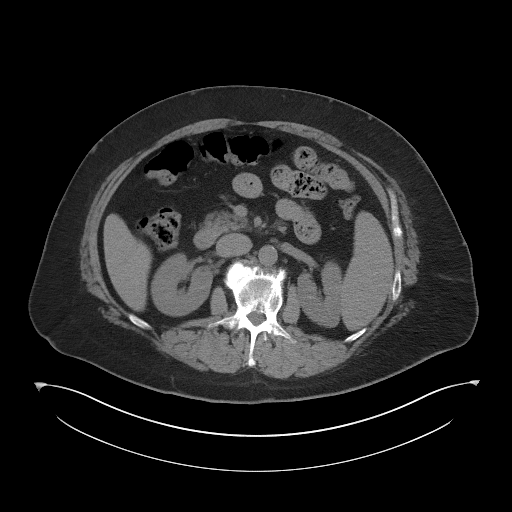
[im 68/104  bone]
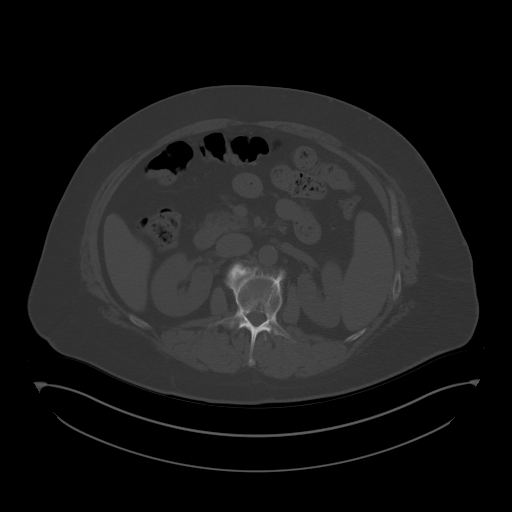
[im 77/104  soft-tissue]
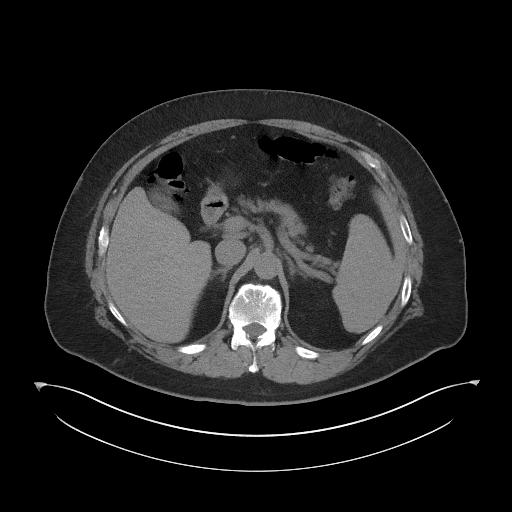
[im 81/104  soft-tissue]
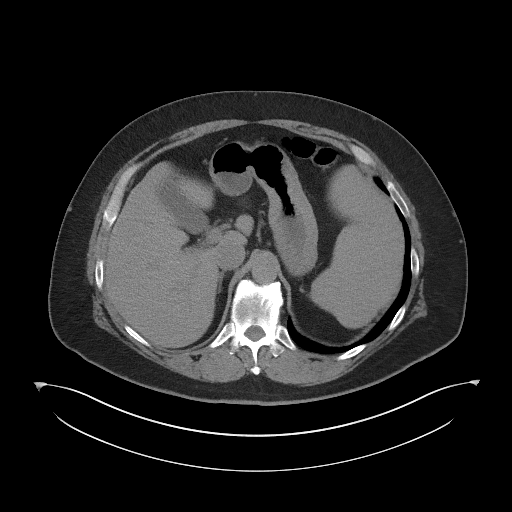
[im 90/104  soft-tissue]
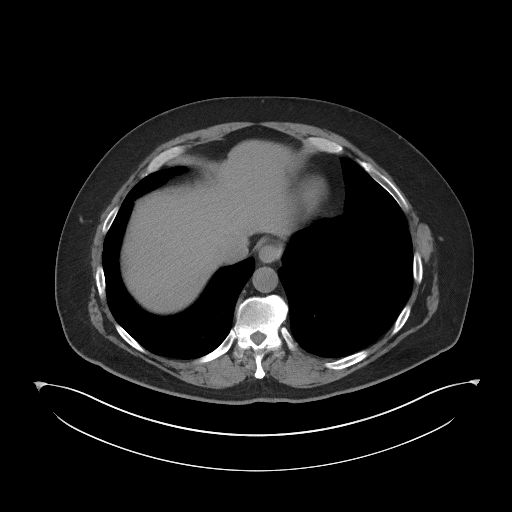
[im 99/104  soft-tissue]
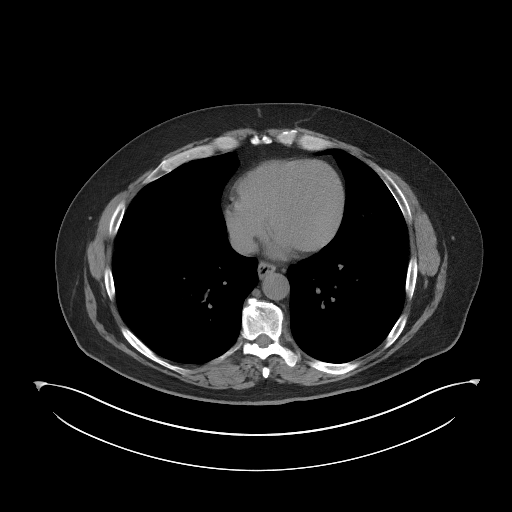

[Series 5: coronal · coronal · 0.94mm/px · 3 of 160 slices shown]
[im 54/160  soft-tissue]
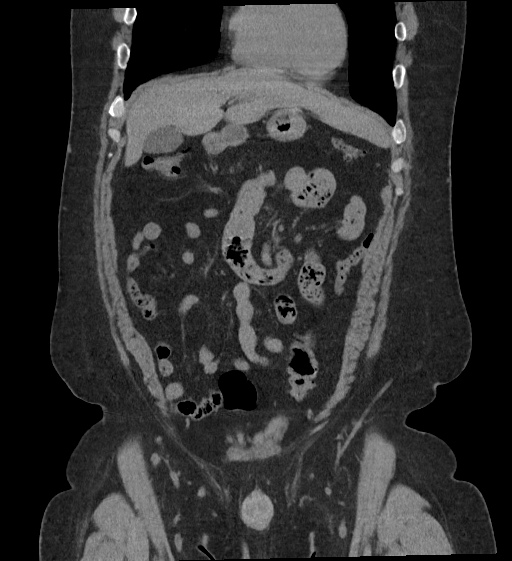
[im 71/160  soft-tissue]
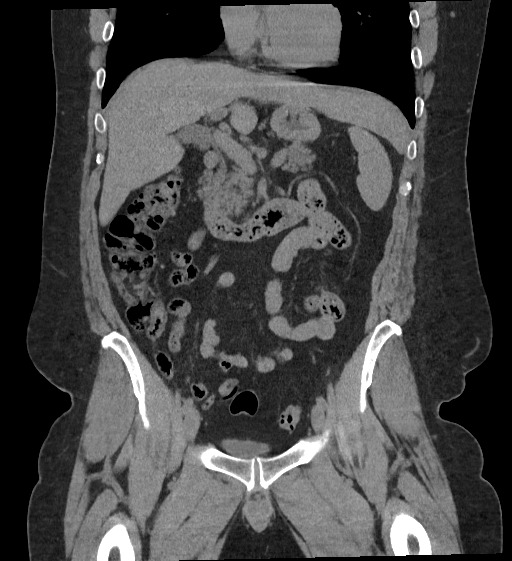
[im 89/160  soft-tissue]
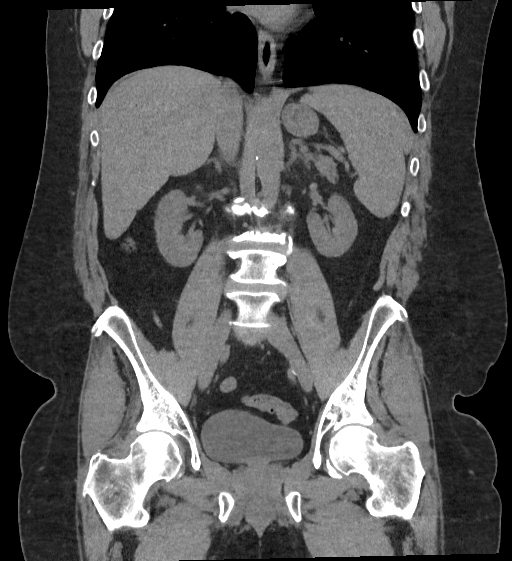

[16 of 46 positions shown; findings below may reference images not displayed]

FINDINGS: Lower chest: Lung bases are clear.

Hepatobiliary: No focal liver abnormality is seen. No gallstones,
gallbladder wall thickening, or biliary dilatation.

Pancreas: Unremarkable. No pancreatic ductal dilatation or
surrounding inflammatory changes.

Spleen: Normal in size without focal abnormality.

Adrenals/Urinary Tract: Adrenal glands are unremarkable. Kidneys are
normal, without renal calculi, focal lesion, or hydronephrosis.
Bladder is unremarkable.

Stomach/Bowel: Stomach is within normal limits. Appendix appears
normal. No evidence of bowel wall thickening, distention, or
inflammatory changes.

Vascular/Lymphatic: Aortic atherosclerosis. No enlarged abdominal or
pelvic lymph nodes.

Reproductive: Prostate is unremarkable.

Other: No free air or free fluid in the abdomen. Moderate
periumbilical hernia containing fat.

Musculoskeletal: No acute or significant osseous findings.
IMPRESSION: 1. No renal or ureteral stone or obstruction.
2. Aortic atherosclerosis.
3. Periumbilical hernia containing fat.

## 2023-03-23 ENCOUNTER — Other Ambulatory Visit: Payer: Self-pay

## 2023-03-23 ENCOUNTER — Emergency Department
Admission: EM | Admit: 2023-03-23 | Discharge: 2023-03-23 | Disposition: A | Discharge status: Home / Self Care | Payer: BC Managed Care – PPO | Attending: Emergency Medicine | Admitting: Emergency Medicine

## 2023-03-23 DIAGNOSIS — T7840XA Allergy, unspecified, initial encounter: Secondary | ICD-10-CM | POA: Diagnosis present

## 2023-03-23 DIAGNOSIS — T782XXA Anaphylactic shock, unspecified, initial encounter: Secondary | ICD-10-CM | POA: Insufficient documentation

## 2023-03-23 MED ORDER — FAMOTIDINE 20 MG PO TABS: 20.0000 mg | ORAL_TABLET | Freq: Every day | ORAL | 0 refills | Status: AC

## 2023-03-23 MED ORDER — EPINEPHRINE 0.3 MG/0.3ML IJ SOAJ
0.3000 mg | Freq: Once | INTRAMUSCULAR | Status: AC
  Administered 2023-03-23: 0.3 mg via INTRAMUSCULAR
  Filled 2023-03-23: qty 0.3

## 2023-03-23 MED ORDER — SODIUM CHLORIDE 0.9 % IV BOLUS
1000.0000 mL | Freq: Once | INTRAVENOUS | Status: AC
  Administered 2023-03-23: 1000 mL via INTRAVENOUS

## 2023-03-23 MED ORDER — DIPHENHYDRAMINE HCL 50 MG/ML IJ SOLN
25.0000 mg | Freq: Once | INTRAMUSCULAR | Status: AC
  Administered 2023-03-23: 25 mg via INTRAVENOUS
  Filled 2023-03-23: qty 1

## 2023-03-23 MED ORDER — METHYLPREDNISOLONE SODIUM SUCC 125 MG IJ SOLR
125.0000 mg | Freq: Once | INTRAMUSCULAR | Status: AC
  Administered 2023-03-23: 125 mg via INTRAVENOUS
  Filled 2023-03-23: qty 2

## 2023-03-23 MED ORDER — LIDOCAINE VISCOUS HCL 2 % MT SOLN
15.0000 mL | Freq: Once | OROMUCOSAL | Status: AC
  Administered 2023-03-23: 15 mL via OROMUCOSAL
  Filled 2023-03-23: qty 15

## 2023-03-23 MED ORDER — EPINEPHRINE 0.3 MG/0.3ML IJ SOAJ: 0.3000 mg | INTRAMUSCULAR | 2 refills | Status: AC | PRN

## 2023-03-23 MED ORDER — ALUM & MAG HYDROXIDE-SIMETH 200-200-20 MG/5ML PO SUSP
15.0000 mL | Freq: Once | ORAL | Status: AC
  Administered 2023-03-23: 15 mL via ORAL
  Filled 2023-03-23: qty 30

## 2023-03-23 MED ORDER — FAMOTIDINE IN NACL 20-0.9 MG/50ML-% IV SOLN
20.0000 mg | Freq: Once | INTRAVENOUS | Status: AC
  Administered 2023-03-23: 20 mg via INTRAVENOUS
  Filled 2023-03-23: qty 50

## 2023-03-23 MED ORDER — PREDNISONE 50 MG PO TABS: 50.0000 mg | ORAL_TABLET | Freq: Every day | ORAL | 0 refills | Status: AC

## 2023-03-23 MED ORDER — ONDANSETRON HCL 4 MG/2ML IJ SOLN
4.0000 mg | Freq: Once | INTRAMUSCULAR | Status: AC
  Administered 2023-03-23: 4 mg via INTRAVENOUS
  Filled 2023-03-23: qty 2

## 2023-03-23 NOTE — ED Provider Notes (Signed)
[facility] Provider Note    Event Date/Time   First MD Initiated Contact with Patient 03/23/23 1350     (approximate)   History   Allergic Reaction   HPI  Gregory Love is a 52 y.o. male no significant past medical history who presents to the emergency department with symptoms concerning for an allergic reaction.  Patient states that he was eating potatoes today when felt like 1 got stuck in his throat.  He then started to breakout and an itching rash, felt like his throat was closing and he was having chest tightness.  Denies any significant nausea vomiting or diaphoresis.  No diarrhea.  Drink multiple glasses of water afterwards and still feels like something is stuck in his throat however he was tolerating p.o. without it coming back up.  No history of an anaphylactic reaction in the past or allergic reaction to potatoes.  No new medications.  No known bee stings.     Physical Exam   Triage Vital Signs: ED Triage Vitals  Encounter Vitals Group     BP      Systolic BP Percentile      Diastolic BP Percentile      Pulse      Resp      Temp      Temp src      SpO2      Weight      Height      Head Circumference      Peak Flow      Pain Score      Pain Loc      Pain Education      Exclude from Growth Chart     Most recent vital signs: Vitals:   03/23/23 1353 03/23/23 1500  BP: 137/80 123/65  Pulse: 83 72  Temp: 98 F (36.7 C)   SpO2: 94% 98%    Physical Exam Constitutional:      General: He is in acute distress.     Appearance: He is well-developed.  HENT:     Head: Atraumatic.  Eyes:     Conjunctiva/sclera: Conjunctivae normal.  Cardiovascular:     Rate and Rhythm: Regular rhythm.  Pulmonary:     Effort: No respiratory distress.     Comments: Diffuse wheezing on lung exam Musculoskeletal:     Cervical back: Normal range of motion.  Skin:    General: Skin is warm.     Findings: Rash present.     Comments: Raised  urticarial rash to the left upper extremity, right upper extremity in the chest wall  Neurological:     Mental Status: He is alert. Mental status is at baseline.     IMPRESSION / MDM / ASSESSMENT AND PLAN / ED COURSE  I reviewed the triage vital signs and the nursing notes.  Differential diagnosis including anaphylactic reaction, allergic reaction, globus sensation, food impaction  Patient drank a can of Coca-Cola, continue to have globus sensation but did not have any trouble keeping down Coca-Cola, no concern for food impaction at this time.  Does not need an emergent endoscopy  EKG  I, Corena Herter, the attending physician, personally viewed and interpreted this ECG.   Rate: Normal  Rhythm: Normal sinus  Axis: Normal  Intervals: Normal  ST&T Change: None  No tachycardic or bradycardic dysrhythmias while on cardiac telemetry.  LABS (all labs ordered are listed, but only abnormal results are displayed) Labs interpreted as -    Labs  Reviewed - No data to display   MDM  Patient was treated with epinephrine, Zofran, IV fluids, steroids and Pepcid  On reevaluation continues to have itching sensation and globus sensation.  Given another dose of Benadryl.  Given GI cocktail and viscous lidocaine.       PROCEDURES:  Critical Care performed: yes  .Critical Care  Performed by: Corena Herter, MD Authorized by: Corena Herter, MD   Critical care provider statement:    Critical care time (minutes):  45   Critical care time was exclusive of:  Separately billable procedures and treating other patients   Critical care was necessary to treat or prevent imminent or life-threatening deterioration of the following conditions: Anaphylactic reaction.   Critical care was time spent personally by me on the following activities:  Development of treatment plan with patient or surrogate, discussions with consultants, evaluation of patient's response to treatment, examination of  patient, ordering and review of laboratory studies, ordering and review of radiographic studies, ordering and performing treatments and interventions, pulse oximetry, re-evaluation of patient's condition and review of old charts   Patient's presentation is most consistent with acute presentation with potential threat to life or bodily function.   MEDICATIONS ORDERED IN ED: Medications  lidocaine (XYLOCAINE) 2 % viscous mouth solution 15 mL (has no administration in time range)  alum & mag hydroxide-simeth (MAALOX/MYLANTA) 200-200-20 MG/5ML suspension 15 mL (has no administration in time range)  diphenhydrAMINE (BENADRYL) injection 25 mg (has no administration in time range)  sodium chloride 0.9 % bolus 1,000 mL (0 mLs Intravenous Stopped 03/23/23 1512)  diphenhydrAMINE (BENADRYL) injection 25 mg (25 mg Intravenous Given 03/23/23 1401)  EPINEPHrine (EPI-PEN) injection 0.3 mg (0.3 mg Intramuscular Given 03/23/23 1405)  ondansetron (ZOFRAN) injection 4 mg (4 mg Intravenous Given 03/23/23 1408)  methylPREDNISolone sodium succinate (SOLU-MEDROL) 125 mg/2 mL injection 125 mg (125 mg Intravenous Given 03/23/23 1402)  famotidine (PEPCID) IVPB 20 mg premix (0 mg Intravenous Stopped 03/23/23 1512)    FINAL CLINICAL IMPRESSION(S) / ED DIAGNOSES   Final diagnoses:  Anaphylaxis, initial encounter  Allergic reaction, initial encounter     Rx / DC Orders   ED Discharge Orders          Ordered    EPINEPHrine 0.3 mg/0.3 mL IJ SOAJ injection  As needed        03/23/23 1536    predniSONE (DELTASONE) 50 MG tablet  Daily with breakfast        03/23/23 1536    famotidine (PEPCID) 20 MG tablet  Daily        03/23/23 1536             Note:  This document was prepared using Dragon voice recognition software and may include unintentional dictation errors.   Corena Herter, MD 03/23/23 [phone]

## 2023-03-23 NOTE — Discharge Instructions (Addendum)
You are seen in the emergency department with symptoms of allergic reaction -it is importantly follow-up with your primary care provider.  You likely will need allergy testing with an allergist.  Avoid potatoes or any other food you ate today until he can follow-up with an allergist.  You are given a prescription for an EpiPen, steroid and an acid reducing medication.  Prednisone -you are given a prescription for a steroid.  It is important that you take this medication with food.  This medication can cause an upset stomach

## 2023-03-23 NOTE — ED Notes (Signed)
Pt reports finishing the cola without any vomiting but states "I still feel that lump in my chest." Pt able to speak in complete sentences. Call light within reach. Friend at bedside.

## 2023-03-23 NOTE — ED Triage Notes (Signed)
Pt  arrive pov. Pt complained of itching and swelling bilaterally in upper extremities and chest tightness after the consumption of potatoes. Pt denies SOB.

## 2023-10-23 ENCOUNTER — Ambulatory Visit: Payer: PRIVATE HEALTH INSURANCE | Admitting: Urology
